# Patient Record
Sex: Male | Born: 1965
Health system: Southern US, Community
[De-identification: ages and names within clinical notes are randomized; demographics above are authoritative.]

---

## 1998-10-13 ENCOUNTER — Other Ambulatory Visit: Admission: RE | Admit: 1998-10-13 | Discharge: 1998-10-13 | Payer: Self-pay | Admitting: Urology

## 2007-12-01 ENCOUNTER — Ambulatory Visit (HOSPITAL_COMMUNITY): Admission: RE | Admit: 2007-12-01 | Discharge: 2007-12-01 | Payer: Self-pay | Admitting: *Deleted

## 2010-05-22 NOTE — Op Note (Signed)
NAME:  Jeffrey Weaver, Jeffrey Weaver NO.:  [id number]   MEDICAL RECORD NO.:  [id number]          PATIENT TYPE:  AMB   LOCATION:  ENDO                         FACILITY:  [facility]   PHYSICIAN:  Georgiana Spinner, M.D.    DATE OF BIRTH:  1965/10/10   DATE OF PROCEDURE:  DATE OF DISCHARGE:                               OPERATIVE REPORT   PROCEDURE:  Colonoscopy.   INDICATIONS:  Rectal bleeding.  Question of polyp in the rectum.   ANESTHESIA:  Fentanyl 85 mcg, Versed 8 mg.   PROCEDURE:  With the patient mildly sedated in the left lateral  decubitus position a rectal examination was performed.  Perineum  appeared normal and rectal digital exam felt normal to me including the  prostate.  Subsequently, the Pentax videoscopic colonoscope was inserted  in the rectum and passed under direct vision with pressure applied to  reach the cecum identified by ileocecal valve and appendiceal orifice  both which were photographed.  From this point the colonoscope was  slowly withdrawn taking circumferential views of colonic mucosa stopping  in the rectum which appeared normal on direct and showed possible small  hemorrhoids on retroflexed view.  The endoscope was straightened and  withdrawn, pulled through the anal canal which appeared normal.  The  patient's vital signs and pulse oximeter remained stable.  The patient  tolerated the procedure well without apparent complication.   FINDINGS:  Questionable small internal hemorrhoid, otherwise  unremarkable examination.   PLAN:  I would consider repeat examination in 5-10 years.           ______________________________  Georgiana Spinner, M.D.     GMO/MEDQ  D:  12/01/2007  T:  12/01/2007  Job:  [id number]

## 2010-06-26 ENCOUNTER — Emergency Department (HOSPITAL_COMMUNITY): Payer: 59

## 2010-06-26 ENCOUNTER — Emergency Department (HOSPITAL_COMMUNITY)
Admission: EM | Admit: 2010-06-26 | Discharge: 2010-06-26 | Disposition: A | Discharge status: Home / Self Care | Payer: 59 | Attending: Emergency Medicine | Admitting: Emergency Medicine

## 2010-06-26 DIAGNOSIS — K7689 Other specified diseases of liver: Secondary | ICD-10-CM | POA: Insufficient documentation

## 2010-06-26 DIAGNOSIS — N201 Calculus of ureter: Secondary | ICD-10-CM | POA: Insufficient documentation

## 2010-06-26 DIAGNOSIS — R1032 Left lower quadrant pain: Secondary | ICD-10-CM | POA: Insufficient documentation

## 2010-06-26 LAB — CBC
HCT: 44.9 % (ref 39.0–52.0)
RBC: 5.51 MIL/uL (ref 4.22–5.81)
RDW: 13.2 % (ref 11.5–15.5)
WBC: 14.9 10*3/uL — ABNORMAL HIGH (ref 4.0–10.5)

## 2010-06-26 LAB — URINE MICROSCOPIC-ADD ON

## 2010-06-26 LAB — URINALYSIS, ROUTINE W REFLEX MICROSCOPIC
Glucose, UA: NEGATIVE mg/dL
Ketones, ur: NEGATIVE mg/dL
Protein, ur: 30 mg/dL — AB

## 2010-06-26 LAB — DIFFERENTIAL
Basophils Absolute: 0 10*3/uL (ref 0.0–0.1)
Eosinophils Relative: 0 % (ref 0–5)
Lymphocytes Relative: 6 % — ABNORMAL LOW (ref 12–46)
Neutro Abs: 13.5 10*3/uL — ABNORMAL HIGH (ref 1.7–7.7)
Neutrophils Relative %: 90 % — ABNORMAL HIGH (ref 43–77)

## 2010-06-26 LAB — COMPREHENSIVE METABOLIC PANEL
AST: 25 U/L (ref 0–37)
Albumin: 4.4 g/dL (ref 3.5–5.2)
Alkaline Phosphatase: 84 U/L (ref 39–117)
BUN: 20 mg/dL (ref 6–23)
CO2: 27 mEq/L (ref 19–32)
Chloride: 101 mEq/L (ref 96–112)
GFR calc non Af Amer: >60 mL/min (ref >60)
Potassium: 4.6 mEq/L (ref 3.5–5.1)
Total Bilirubin: 1.3 mg/dL — ABNORMAL HIGH (ref 0.3–1.2)

## 2010-06-26 MED ORDER — IOHEXOL 300 MG/ML  SOLN
100.0000 mL | Freq: Once | INTRAMUSCULAR | Status: AC | PRN
  Administered 2010-06-26: 100 mL via INTRAVENOUS

## 2012-06-16 IMAGING — CT CT ABD-PELV W/ CM
1 of 3 series · 14 of 32 positions shown, 19 images · IV contrast (agent unspecified)
Comparison: None

CLINICAL DATA: Abdominal pain

CT ABDOMEN AND PELVIS WITH CONTRAST
TECHNIQUE: Multidetector CT imaging of the abdomen and pelvis was
performed following the standard protocol during bolus
administration of intravenous contrast.
Contrast: 100 ml of omni 300

[Series 2: rtn ap with st · axial · 0.79mm/px · z∈[-432,-2]mm · 14 of 98 slices shown, 19 images]
[im 6/98  soft-tissue]
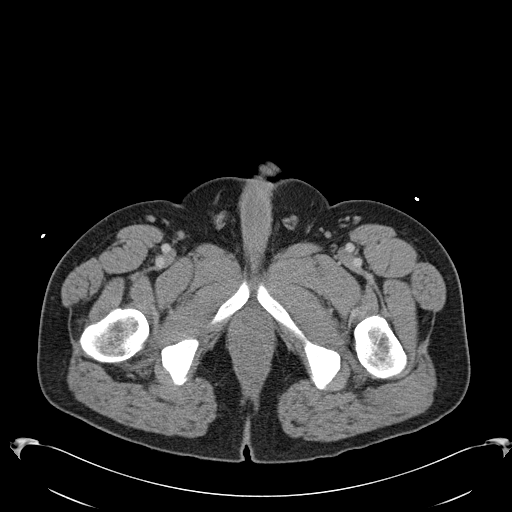
[im 6/98  bone]
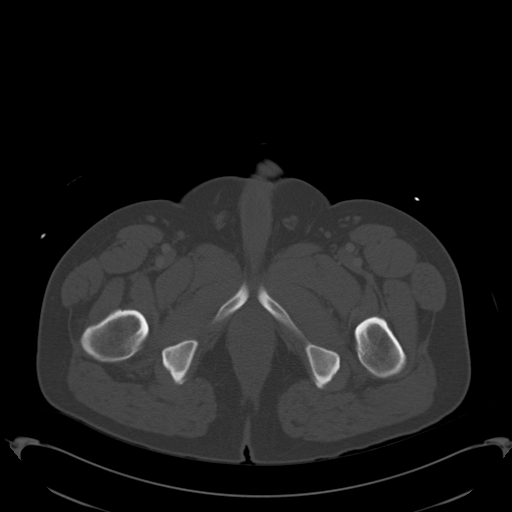
[im 16/98  soft-tissue]
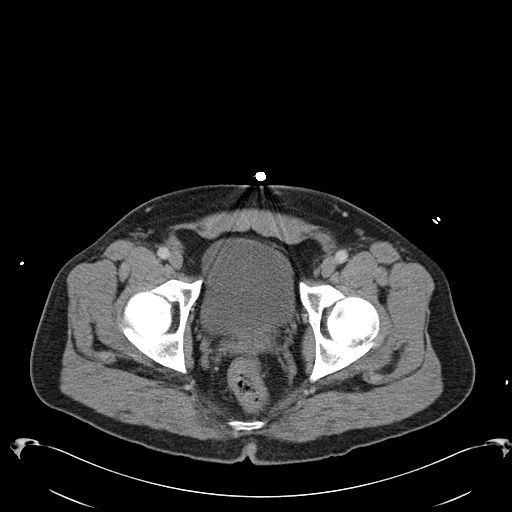
[im 21/98  soft-tissue]
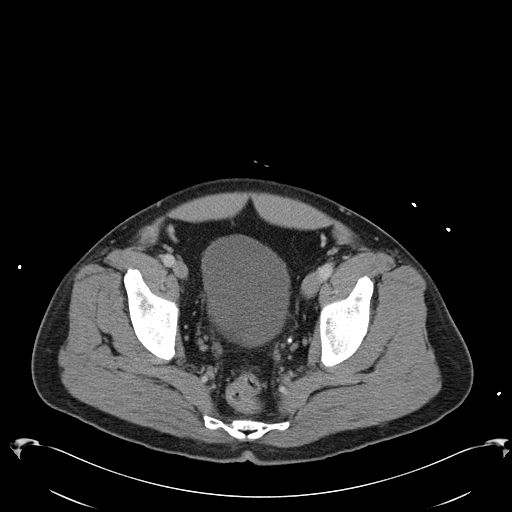
[im 26/98  soft-tissue]
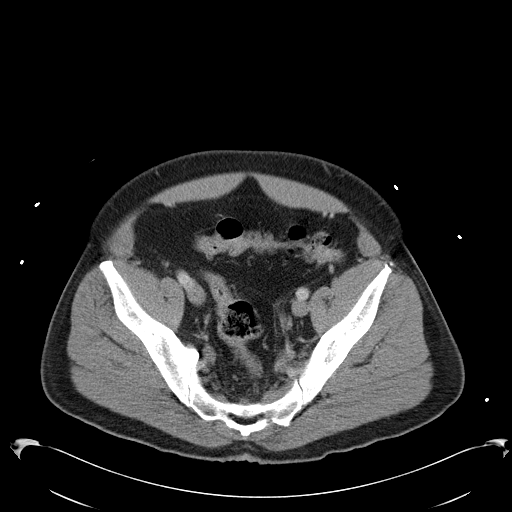
[im 36/98  soft-tissue]
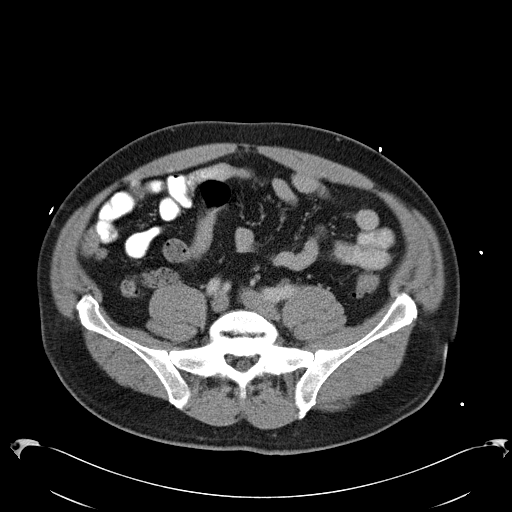
[im 41/98  soft-tissue]
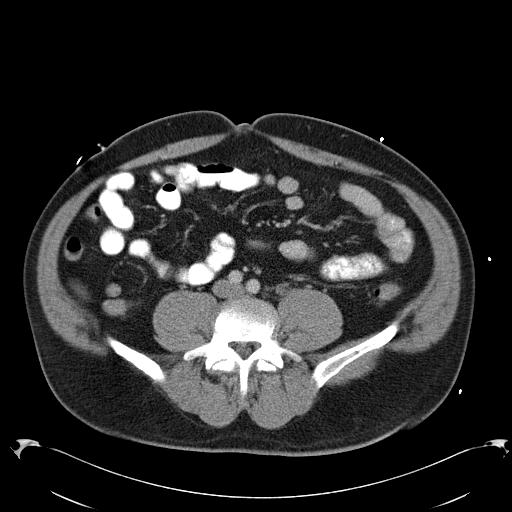
[im 52/98  soft-tissue]
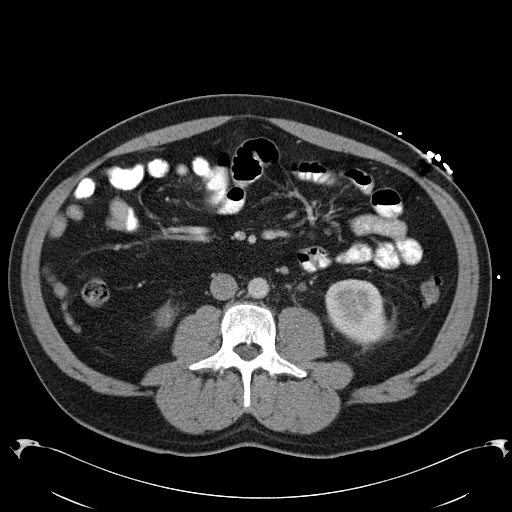
[im 57/98  soft-tissue]
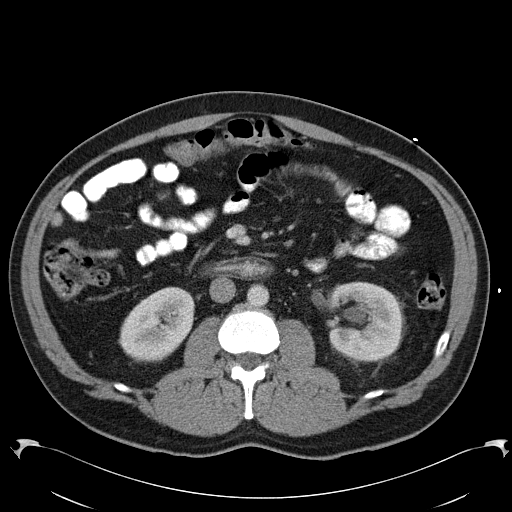
[im 62/98  soft-tissue]
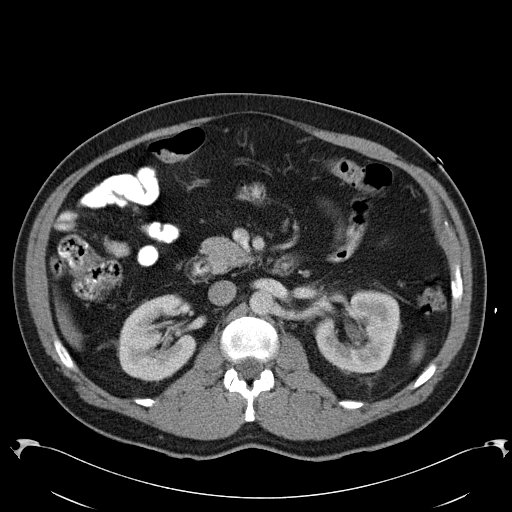
[im 62/98  bone]
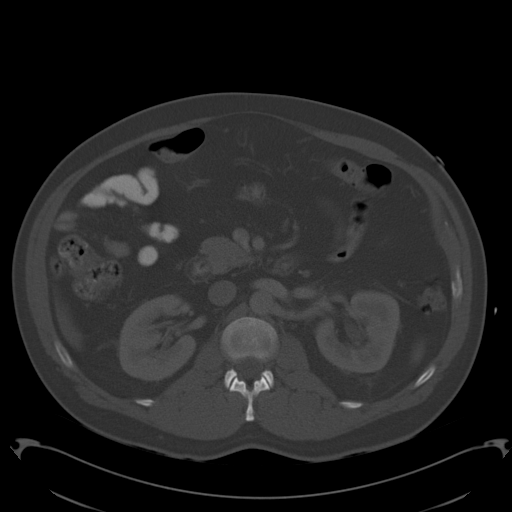
[im 72/98  soft-tissue]
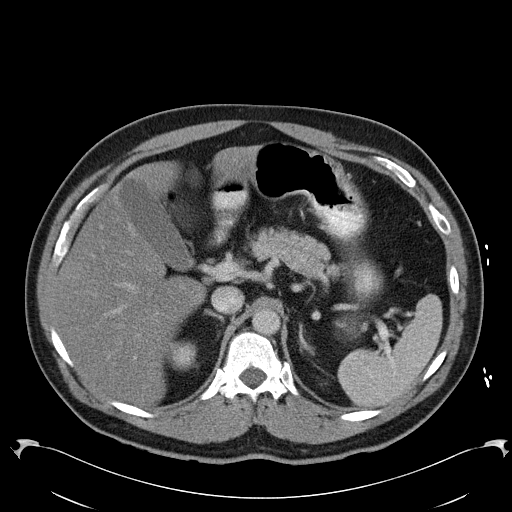
[im 77/98  soft-tissue]
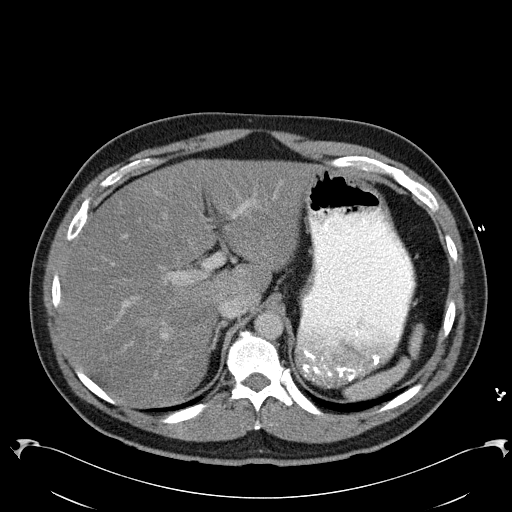
[im 77/98  lung]
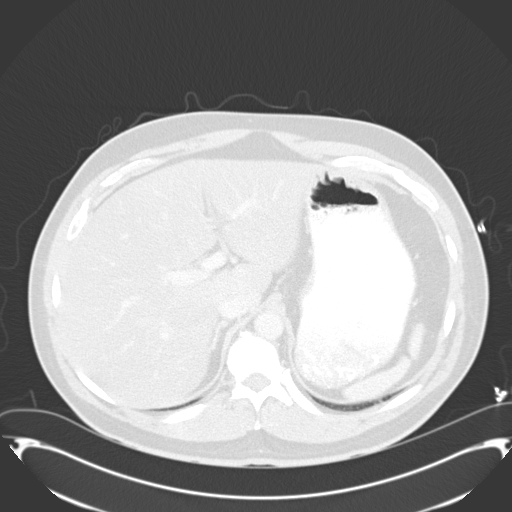
[im 82/98  soft-tissue]
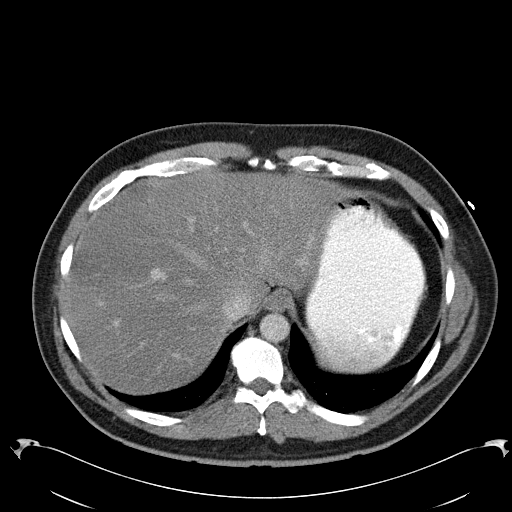
[im 82/98  lung]
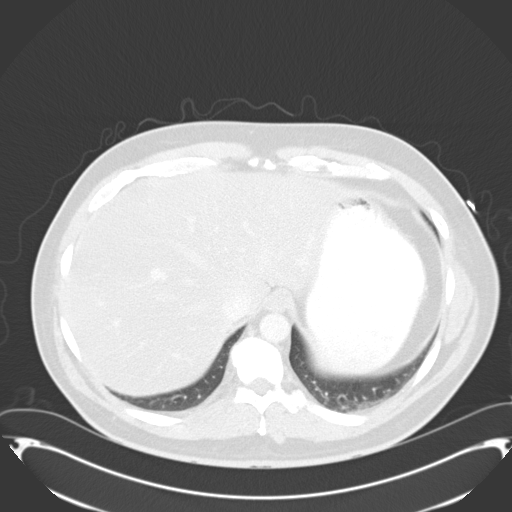
[im 87/98  lung]
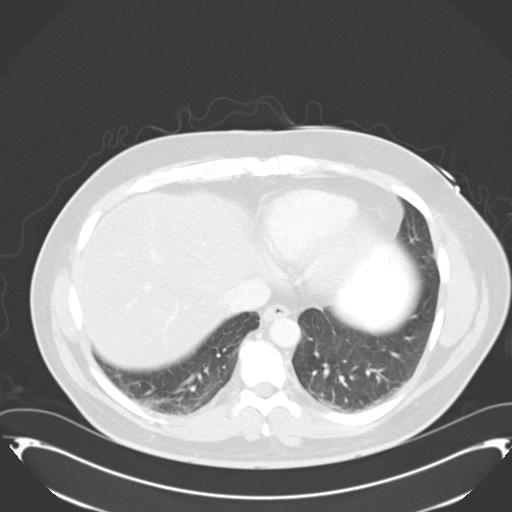
[im 92/98  soft-tissue]
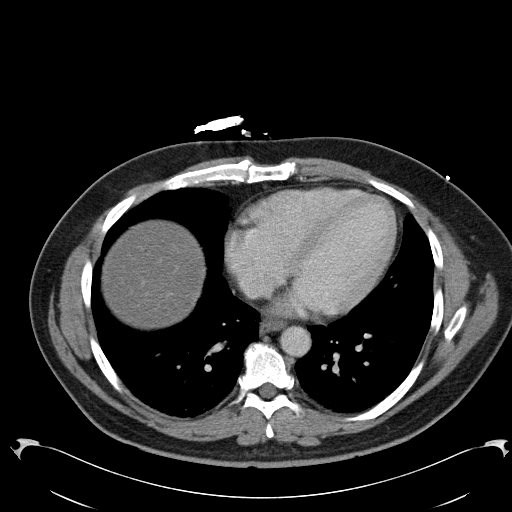
[im 92/98  lung]
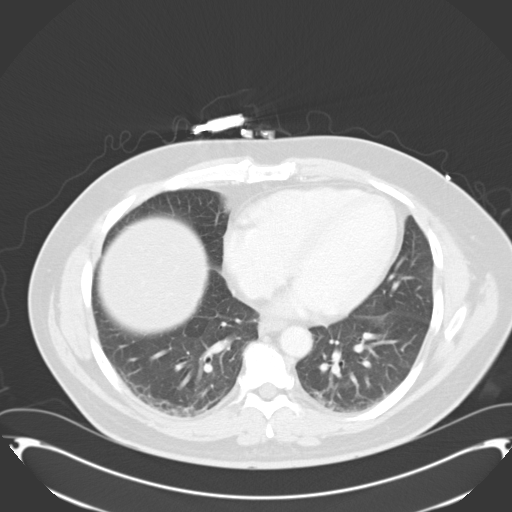

[14 of 32 positions shown; findings below may reference images not displayed]

FINDINGS: The lung bases are clear.

No pericardial or pleural effusion identified.  There is no focal
liver abnormalities identified.

Mild diffuse fatty infiltration of the liver parenchyma noted.

The pancreas appears normal.

The spleen appears normal.

Both adrenal glands are normal.

Gallbladder is identified and appears normal.  There is no biliary
dilatation.

Normal appearance of the right kidney.  Small hypodensity within
the upper pole the left kidney is noted.  This is too small to
characterize.

There is asymmetric left-sided hydronephrosis, hydroureter and
perinephric fat stranding.  Within the distal left ureter there is
a stone measuring 3.2 mm, image 78.

No enlarged upper abdominal lymph nodes.

There is no pelvic or inguinal lymph nodes.

No free fluid or fluid collections within the abdomen or pelvis.

The stomach and the small bowel loops appear normal.

The appendix is identified and is within normal limits.

The proximal colon is negative.

There are multiple diverticula involving the sigmoid colon.  No
active inflammation.

Review of the visualized osseous structures is significant for mild
spondylosis.
IMPRESSION: 1.  Left-sided hydronephrosis and hydroureter secondary to distal
left ureteral calculus.
2.  Fatty infiltration of the liver.

## 2017-07-17 DIAGNOSIS — I10 Essential (primary) hypertension: Secondary | ICD-10-CM | POA: Diagnosis not present

## 2017-07-17 DIAGNOSIS — Z125 Encounter for screening for malignant neoplasm of prostate: Secondary | ICD-10-CM | POA: Diagnosis not present

## 2017-07-17 DIAGNOSIS — Z0000 Encounter for general adult medical examination without abnormal findings: Secondary | ICD-10-CM | POA: Diagnosis not present

## 2017-07-17 DIAGNOSIS — R7309 Other abnormal glucose: Secondary | ICD-10-CM | POA: Diagnosis not present

## 2017-07-22 DIAGNOSIS — I10 Essential (primary) hypertension: Secondary | ICD-10-CM | POA: Diagnosis not present

## 2017-07-22 DIAGNOSIS — Z1212 Encounter for screening for malignant neoplasm of rectum: Secondary | ICD-10-CM | POA: Diagnosis not present

## 2017-07-22 DIAGNOSIS — G4733 Obstructive sleep apnea (adult) (pediatric): Secondary | ICD-10-CM | POA: Diagnosis not present

## 2017-07-22 DIAGNOSIS — Z0000 Encounter for general adult medical examination without abnormal findings: Secondary | ICD-10-CM | POA: Diagnosis not present

## 2017-07-30 DIAGNOSIS — E782 Mixed hyperlipidemia: Secondary | ICD-10-CM | POA: Diagnosis not present

## 2017-09-18 DIAGNOSIS — E782 Mixed hyperlipidemia: Secondary | ICD-10-CM | POA: Diagnosis not present

## 2017-09-18 DIAGNOSIS — E291 Testicular hypofunction: Secondary | ICD-10-CM | POA: Diagnosis not present

## 2017-09-22 DIAGNOSIS — E782 Mixed hyperlipidemia: Secondary | ICD-10-CM | POA: Diagnosis not present

## 2017-09-22 DIAGNOSIS — R899 Unspecified abnormal finding in specimens from other organs, systems and tissues: Secondary | ICD-10-CM | POA: Diagnosis not present

## 2017-09-22 DIAGNOSIS — E291 Testicular hypofunction: Secondary | ICD-10-CM | POA: Diagnosis not present

## 2017-09-29 DIAGNOSIS — E663 Overweight: Secondary | ICD-10-CM | POA: Diagnosis not present

## 2017-10-08 ENCOUNTER — Telehealth: Payer: Self-pay | Admitting: Hematology and Oncology

## 2017-10-08 ENCOUNTER — Encounter: Payer: Self-pay | Admitting: Hematology and Oncology

## 2017-10-08 NOTE — Telephone Encounter (Signed)
New referral received from Dr. Shelia Media for hemochromatosis. Pt has been cld and scheduled to see Dr. Audelia Hives on 10/16 at 11am. Pt aware to arrive 30 minutes early. Letter mailed.

## 2017-10-21 NOTE — Progress Notes (Signed)
[facility] Initial Consultation  Patient Name:  Jeffrey Weaver  DOB: 06-02-1965   Date of Service: October 22, 2017  Referring Provider: Deland Pretty, [name], [name] [address]   Consulting Physician: Henreitta Leber, MD Hematology/Oncology   Reason for Referral: In the setting of hyperferritinemia and possible iron overload of unknown etiology, he presents now for further diagnostic and therapeutic recommendations.  History Present Illness: Jeffrey Weaver is a 52 year old resident of [place] whose past medical history is significant for dyslipidemia; reflux uropathy; primary hypertension; allergic rhinitis; previous renal calculi 9 years earlier; hypogonadism; low testosterone; erectile dysfunction; and fatty liver.  He has had no recent hospitalizations.  He takes multivitamins and grapeseed extract.  He is not taking supplemental iron or vitamin C.  In his family there is no history of hereditary hemochromatosis or iron overload syndrome.  His primary care physician is Dr. Deland Pretty.  His is followed also by Dr. Madelin Rear, endocrinology.  He is alone at this first visit.  On September 22, 2017 a serum ferritin was 415 (30-400).  Serum iron was 95; TIBC 336; iron saturation 28%.  On September 19, 2017 prolactin level was 11.3 (4-15.2); LH 3.9 (1.7-8.6); FSH 5.0 (1.5-12.4); serum ferritin 481.  On July 23, 2017 serum testosterone 198; ferritin 481 total cholesterol 184 HDL cholesterol 28 LDL cholesterol 107 triglycerides 245; VLDL 49 on July 11: A complete blood count showed hemoglobin 14.8 hematocrit 43.4 WBC 7.2 with 52% neutrophils 36% lymphocytes 8% monocytes 4% eosinophils; platelets 258,000.  On July 22, 2017 serum testosterone 198 ferritin 481.  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  His alcohol intake consists of beer or wine once or twice monthly.  He has no diabetes mellitus coronary artery  disease, or cardiac dysrhythmia.  He reports no seizure disorder or stroke syndrome.  He has no glaucoma or macular degeneration.  There is no peptic ulcer or gastroesophageal reflux disease.  No viral hepatitis, inflammatory bowel disease, or symptomatic diverticulosis are reported.  He has had a screening colonoscopy 7 years ago.  He has no prostate cancer or prostatitis.  He reports no rheumatoid or gouty arthritis.  He has no prior blood disorder or bleeding tendency.  He denies peripheral arterial or venous thromboembolic disease.  He is not a vegetarian.  Both his appetite and weight are stable.  He has no rash or itching.  There is no unusual headache, dizziness, lightheadedness, syncope, or near syncopal episodes.  He has no visual changes or hearing deficit.  He denies unusual cough, sore throat, orthopnea.  He has no pain or difficulty in swallowing.  No fever, shaking chills, sweats, or flulike symptoms are evident.  He has no heartburn or indigestion.  No nausea, vomiting, diarrhea, or constipation are reported.  He denies melena or bright red blood per rectum.  No urinary frequency, urgency, hematuria, or dysuria are evident.  He has no focal bone, joint, or muscle pain.  There is no bleeding tendency or easy bruisability.  He denies numbness or tingling in the fingers or toes.  It is with this background he presents now for further diagnostic and therapeutic recommendations in the setting of hyperferritinemia to rule out iron overload.  Past Medical History: Hypertriglyceridemia Primary hypertension Renal calculi Reflux uropathy Hypogonadism Erectile dysfunction Low testosterone Allergic rhinitis Fatty liver  Surgical History: None  Family History: Mother: Age 2 years: Breast cancer Father: Age 74 years: Celiac disease Brothers (1): No major  medical problems He has no sisters  Social History   Socioeconomic History  . Marital status: Married    Spouse name: Not on file  .  Number of children: Not on file  . Years of education: Not on file  . Highest education level: Not on file  Occupational History  . Not on file  Social Needs  . Financial resource strain: Not on file  . Food insecurity:    Worry: Not on file    Inability: Not on file  . Transportation needs:    Medical: Not on file    Non-medical: Not on file  Tobacco Use  . Smoking status: Not on file  Substance and Sexual Activity  . Alcohol use: Not on file  . Drug use: Not on file  . Sexual activity: Not on file  Lifestyle  . Physical activity:    Days per week: Not on file    Minutes per session: Not on file  . Stress: Not on file  Relationships  . Social connections:    Talks on phone: Not on file    Gets together: Not on file    Attends religious service: Not on file    Active member of club or organization: Not on file    Attends meetings of clubs or organizations: Not on file    Relationship status: Not on file  . Intimate partner violence:    Fear of current or ex partner: Not on file    Emotionally abused: Not on file    Physically abused: Not on file    Forced sexual activity: Not on file  Other Topics Concern  . Not on file  Social History Narrative  . 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 He is married for the past 24 years. He has 3 children without major medical problems He is a lifetime non-smoker. No significant use of pipe, cigars, or chewing tobacco. His alcohol intake consists of 1-2 drinks monthly either beer and/or wine.  Transfusion History: No prior transfusion  Exposure History: He has no known exposure to toxic chemicals, radiation, or pesticides.  Allergies:  Penicillin causes swelling of the face, tongue, and lips [place], tree nuts, fresh fruits with seeds and skin, carrots, celery, and hazelnuts cause rash and diarrhea He has nonspecific seasonal allergies  Current Outpatient Medications on File Prior to Visit  Medication Sig  .  cetirizine (ZYRTEC) 10 MG chewable tablet Chew 10 mg by mouth daily.  . fenofibrate (TRICOR) 145 MG tablet Take 145 mg by mouth daily.  . fluticasone (FLONASE) 50 MCG/ACT nasal spray Place into both nostrils daily as needed for allergies or rhinitis.  Marland Kitchen irbesartan-hydrochlorothiazide (AVALIDE) 300-12.5 MG tablet Take 1 tablet by mouth daily.  . meloxicam (MOBIC) 15 MG tablet Take 15 mg by mouth daily.  . Misc Natural Products (GRAPE SEED COMPLEX) CAPS Take 2 capsules by mouth every morning.   . Multiple Vitamins-Minerals (MULTIVITAMIN WITH MINERALS) tablet Take 1 tablet by mouth daily.  Marland Kitchen omega-3 acid ethyl esters (LOVAZA) 1 g capsule Take 1 g by mouth 2 (two) times daily.   . tadalafil (ADCIRCA/CIALIS) 20 MG tablet Take 20 mg by mouth daily as needed for erectile dysfunction.   No current facility-administered medications on file prior to visit.     Review of Systems: Constitutional: No fever, sweats, or shaking chills.  No appetite or weight deficit; elevated BMI. Skin: No rash, scaling, sores, lumps, or jaundice. HEENT: No visual changes or hearing deficit;  allergic rhinitis; no sinusitis. Pulmonary: No unusual cough, sore throat, or orthopnea.  No DOE/COPD. Cardiovascular: No coronary artery disease, angina, or myocardial infarction.  No cardiac dysrhythmia,. Essential hypertension and dyslipidemia. Gastrointestinal: No indigestion, dysphagia, abdominal pain, diarrhea, or constipation.  No change in bowel habits. Genitourinary: No urinary frequency, urgency, hematuria, or dysuria; hypogonadism; erectile dysfunction; low testosterone. Musculoskeletal: No arthralgias or myalgias; no joint swelling, pain, or instability. Hematologic: No bleeding tendency or easy bruisability. Endocrine: No intolerance to heat or cold; no thyroid disease or diabetes mellitus. Vascular: No peripheral arterial or venous thromboembolic disease. Psychological: No anxiety, depression, or mood changes; no mental  health illnesses. Neurological: No dizziness, lightheadedness, syncope, or near syncopal episodes; no numbness or tingling in the fingers or toes.  Physical Examination: Vital Signs: Body surface area is 2.09 meters squared.  Vitals:   10/22/17 1107  BP: 137/75  Pulse: 66  Resp: 18  Temp: 98.4 F (36.9 C)  SpO2: 100%    Filed Weights   10/22/17 1107  Weight: 207 lb 3.2 oz (94 kg)  ECOG PERFORMANCE STATUS: 0 Constitutional:  Jeffrey Weaver is fully nourished and developed.  He looks age appropriate.  He is friendly and cooperative without respiratory compromise at rest. Skin: No rashes, scaling, dryness, jaundice, or itching. HEENT: Head is normocephalic and atraumatic.  Pupils are equal round and reactive to light and accommodation.  Sclerae are anicteric.  Conjunctivae are pink.  No sinus tenderness nor oropharyngeal lesions.  Lips without cracking or peeling; tongue without mass, inflammation, or nodularity.  Mucous membranes are moist. Neck: Supple and symmetric.  No jugular venous distention or thyromegaly.  Trachea is midline. Lymphatics: No cervical or supraclavicular lymphadenopathy.  No epitrochlear, axillary, or inguinal lymphadenopathy is appreciated. Respiratory/chest: Thorax is symmetrical.  Breath sounds are clear to auscultation and percussion.  Normal excursion and respiratory effort. Back: Symmetric without deformity or tenderness. Cardiovascular: Heart rate and rhythm are regular without murmurs or gallops Gastrointestinal: Abdomen is soft, nontender; no organomegaly.  Bowel sounds are normoactive.  No masses are appreciated. Genitourinary: Normal external male genitalia. Rectal examination: Not performed. Extremities: In the lower extremities, there is no asymmetric swelling, erythema, tenderness, or cord formation.  No clubbing, cyanosis, nor edema. Hematologic: No petechiae, hematomas, or ecchymoses. Psychological:  He is oriented to person, place, and time; normal  affect, memory, and cognition. Neurological: There are no gross neurologic deficits.  Laboratory Results: I have reviewed the data as listed: October 22, 2017   Ref Range & Units 12:27  WBC Count 4.0 - 10.5 K/uL 6.0   RBC 4.22 - 5.81 MIL/uL 5.37   Hemoglobin 13.0 - 17.0 g/dL 15.4   HCT 39.0 - 52.0 % 45.4   MCV 80.0 - 100.0 fL 84.5   MCH 26.0 - 34.0 pg 28.7   MCHC 30.0 - 36.0 g/dL 33.9   RDW 11.5 - 15.5 % 13.2   Platelet Count 150 - 400 K/uL 253   nRBC 0.0 - 0.2 % 0.0   Neutrophils Relative % % 53   Neutro Abs 1.7 - 7.7 K/uL 3.2   Lymphocytes Relative % 34   Lymphs Abs 0.7 - 4.0 K/uL 2.0   Monocytes Relative % 7   Monocytes Absolute 0.1 - 1.0 K/uL 0.4   Eosinophils Relative % 5   Eosinophils Absolute 0.0 - 0.5 K/uL 0.3   Basophils Relative % 1   Basophils Absolute 0.0 - 0.1 K/uL 0.0   Immature Granulocytes % 0   Abs Immature Granulocytes 0.00 -  0.07 K/uL 0.02         Ref Range & Units 12:27 80yr ago  Sodium 135 - 145 mmol/L 140  138 R  Potassium 3.5 - 5.1 mmol/L 4.4  4.6 R  Chloride 98 - 111 mmol/L 105  101 R  CO2 22 - 32 mmol/L 25  27 R  Glucose, Bld 70 - 99 mg/dL 98  141High    BUN 6 - 20 mg/dL 22High   20 R  Creatinine, Ser 0.61 - 1.24 mg/dL 1.02  0.86 R, CM  Calcium 8.9 - 10.3 mg/dL 9.8  10.6High  R  Total Protein 6.5 - 8.1 g/dL 7.8  8.0 R  Albumin 3.5 - 5.0 g/dL 4.3  4.4 R  AST 15 - 41 U/L 39  25 R  ALT 0 - 44 U/L 66High   40 R  Alkaline Phosphatase 38 - 126 U/L 49  84 R  Total Bilirubin 0.3 - 1.2 mg/dL 1.1  1.3High    GFR calc non Af Amer >60 mL/min >60  >60   GFR calc Af Amer >60 mL/min >60  >60 CM  Comment: (NOTE)   Iron 129 TIBC 360 Iron saturation 36% (42-163%) Ferritin 380 (24-336) LDH 151 Reticulocyte count 2.0%  CBC Latest Ref Rng & Units 10/22/2017 06/26/2010  WBC 4.0 - 10.5 K/uL 6.0 14.9(H)  Hemoglobin 13.0 - 17.0 g/dL 15.4 16.0  Hematocrit 39.0 - 52.0 % 45.4 44.9  Platelets 150 - 400 K/uL 253 233    CMP Latest Ref Rng & Units 10/22/2017  06/26/2010  Glucose 70 - 99 mg/dL 98 141(H)  BUN 6 - 20 mg/dL 22(H) 20  Creatinine 0.61 - 1.24 mg/dL 1.02 0.86  Sodium 135 - 145 mmol/L 140 138  Potassium 3.5 - 5.1 mmol/L 4.4 4.6  Chloride 98 - 111 mmol/L 105 101  CO2 22 - 32 mmol/L 25 27  Calcium 8.9 - 10.3 mg/dL 9.8 10.6(H)  Total Protein 6.5 - 8.1 g/dL 7.8 8.0  Total Bilirubin 0.3 - 1.2 mg/dL 1.1 1.3(H)  Alkaline Phos 38 - 126 U/L 49 84  AST 15 - 41 U/L 39 25  ALT 0 - 44 U/L 66(H) 40   Diagnostic/Imaging Studies: June 26, 2010 CT ABDOMEN AND PELVIS WITH CONTRAST  Technique:  Multidetector CT imaging of the abdomen and pelvis was performed following the standard protocol during bolus administration of intravenous contrast.  Contrast: 100 ml of omni 300  Comparison: None  Findings:  The lung bases are clear.  No pericardial or pleural effusion identified.  There is no focal liver abnormalities identified.  Mild diffuse fatty infiltration of the liver parenchyma noted.  The pancreas appears normal.  The spleen appears normal.  Both adrenal glands are normal.  Gallbladder is identified and appears normal.  There is no biliary dilatation.  Normal appearance of the right kidney.  Small hypodensity within the upper pole the left kidney is noted.  This is too small to characterize.  There is asymmetric left-sided hydronephrosis, hydroureter and perinephric fat stranding.  Within the distal left ureter there is a stone measuring 3.2 mm, image 78.  No enlarged upper abdominal lymph nodes.  There is no pelvic or inguinal lymph nodes.  No free fluid or fluid collections within the abdomen or pelvis.  The stomach and the small bowel loops appear normal.  The appendix is identified and is within normal limits.  The proximal colon is negative.  There are multiple diverticula involving the sigmoid colon.  No active inflammation.  Review of the visualized osseous structures is significant for  mild spondylosis.  IMPRESSION:  1.  Left-sided hydronephrosis and hydroureter secondary to distal left ureteral calculus. 2.  Fatty infiltration of the liver.  Angelita Ingles, M.D.  Summary/Assessment: In the setting of hyperferritinemia and possible iron overload of unknown etiology, he presents now for further diagnostic and therapeutic recommendations.  On September 22, 2017 a serum ferritin was 415 (30-400).  Serum iron was 95; TIBC 336; iron saturation 28%.  On September 19, 2017 prolactin level was 11.3 (4-15.2); LH 3.9 (1.7-8.6); FSH 5.0 (1.5-12.4); serum ferritin 481.  On July 23, 2017 serum testosterone 198; ferritin 481 total cholesterol 184 HDL cholesterol 28 LDL cholesterol 107 triglycerides 245; VLDL 49 on July 11: A complete blood count showed hemoglobin 14.8 hematocrit 43.4 WBC 7.2 with 52% neutrophils 36% lymphocytes 8% monocytes 4% eosinophils; platelets 258,000.  On July 22, 2017 serum testosterone 198 ferritin 481.  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  His alcohol intake consists of beer or wine once or twice monthly.  He has no diabetes mellitus coronary artery disease, or cardiac dysrhythmia.  He reports no seizure disorder or stroke syndrome.  He has no glaucoma or macular degeneration.  There is no peptic ulcer or gastroesophageal reflux disease.  No viral hepatitis, inflammatory bowel disease, or symptomatic diverticulosis are reported.  He has had a screening colonoscopy 7 years ago.  He has no prostate cancer or prostatitis.  He reports no rheumatoid or gouty arthritis.  He has no prior blood disorder or bleeding tendency.  He denies peripheral arterial or venous thromboembolic disease.  He is not a vegetarian.  Both his appetite and weight are stable.  He has no rash or itching.  There is no unusual headache, dizziness, lightheadedness, syncope, or near syncopal episodes.  He has no visual changes or hearing deficit.  He denies unusual cough, sore throat,  orthopnea.  He has no pain or difficulty in swallowing.  No fever, shaking chills, sweats, or flulike symptoms are evident.  He has no heartburn or indigestion.  No nausea, vomiting, diarrhea, or constipation are reported.  He denies melena or bright red blood per rectum.  No urinary frequency, urgency, hematuria, or dysuria are evident.  He has no focal bone, joint, or muscle pain.  There is no bleeding tendency or easy bruisability.  He denies numbness or tingling in the fingers or toes.  Recommendation/Plan: The results of his laboratory studies were not available at the time of discharge.  Some of those results are outlined above.  They will be discussed in detail the time of his next visit.  Laboratory studies were obtained to exclude liver disease, hemolysis, or metabolic anomaly.  His iron studies are somewhat confounding.  Both the iron and the TIBC (transferrin) suggest possible iron deficit without anemia.   His serum ferritin is once again mildly elevated.  While this could imply an iron overload phenomenon, the ferritin could also be spuriously elevated as a acute phase reactant.  The SGPT is mildly elevated.  The SGOT is normal.  Those results are detailed above.  I have recommended CRP and HFE gene mutation to exclude either a chronic inflammatory condition responsible for increasing his ferritin when both the iron and the TIBC suggest "iron deficiency" without anemia.  In an active infection/inflammation, the CRP would be elevated and the serum ferritin could also be spuriously elevated. Although nonspecific, the CRP is exquisitely sensitive to infection/inflammation. There would be no impact on  the HFE gene mutation for hereditary hemochromatosis. Those results are pending.  He was advised not to take multivitamins with iron or vitamin C, since vitamin C enhances absorption of dietary iron.  Barring any unforeseen complications, at his request, his next scheduled doctor visit is on  November 11, 2017.  [place] was advised to call us in the interim should any new or untoward problems arise.  The total time spent discussing his previous laboratory studies, methodology for evaluating a mildly elevated ferritin,  preliminary considerations, recommendations was 50 minutes.  At least 50% of that time was spent in discussion, reviewing outside records, laboratory evaluation, counseling, and answering questions. All questions were answered to his satisfaction.   This note was dictated using voice activated technology/software.  Unfortunately, typographical errors are not uncommon, and transcription is subject to mistakes and regrettably misinterpretation.  If necessary, clarification of the above information can be discussed with me at any time.  Thank you Dr. Shelia Media for allowing my participation in the care of Jeffrey Weaver. I will keep you closely informed as the results of his laboratory data become available.  Please do not hesitate to call should any questions arise regarding this initial consultation and discussion.  FOLLOW UP: AS DIRECTED   cc:         Deland Pretty MD.                Madelin Rear MD   Henreitta Leber, MD  Hematology/Oncology [place] [address] [place], [place] [address] Office: [phone] TTSV: [phone]

## 2017-10-22 ENCOUNTER — Encounter: Payer: Self-pay | Admitting: Hematology and Oncology

## 2017-10-22 ENCOUNTER — Inpatient Hospital Stay: Payer: 59 | Attending: Hematology and Oncology | Admitting: Hematology and Oncology

## 2017-10-22 ENCOUNTER — Inpatient Hospital Stay: Payer: 59

## 2017-10-22 ENCOUNTER — Telehealth: Payer: Self-pay | Admitting: Hematology and Oncology

## 2017-10-22 DIAGNOSIS — K760 Fatty (change of) liver, not elsewhere classified: Secondary | ICD-10-CM | POA: Insufficient documentation

## 2017-10-22 DIAGNOSIS — I10 Essential (primary) hypertension: Secondary | ICD-10-CM | POA: Insufficient documentation

## 2017-10-22 DIAGNOSIS — J309 Allergic rhinitis, unspecified: Secondary | ICD-10-CM

## 2017-10-22 DIAGNOSIS — E781 Pure hyperglyceridemia: Secondary | ICD-10-CM | POA: Insufficient documentation

## 2017-10-22 DIAGNOSIS — R7989 Other specified abnormal findings of blood chemistry: Secondary | ICD-10-CM | POA: Insufficient documentation

## 2017-10-22 DIAGNOSIS — E291 Testicular hypofunction: Secondary | ICD-10-CM | POA: Insufficient documentation

## 2017-10-22 LAB — CBC WITH DIFFERENTIAL (CANCER CENTER ONLY)
Abs Immature Granulocytes: 0.02 10*3/uL (ref 0.00–0.07)
Basophils Absolute: 0 10*3/uL (ref 0.0–0.1)
Basophils Relative: 1 %
EOS ABS: 0.3 10*3/uL (ref 0.0–0.5)
EOS PCT: 5 %
HEMATOCRIT: 45.4 % (ref 39.0–52.0)
HEMOGLOBIN: 15.4 g/dL (ref 13.0–17.0)
Immature Granulocytes: 0 %
LYMPHS ABS: 2 10*3/uL (ref 0.7–4.0)
Lymphocytes Relative: 34 %
MCH: 28.7 pg (ref 26.0–34.0)
MCHC: 33.9 g/dL (ref 30.0–36.0)
MCV: 84.5 fL (ref 80.0–100.0)
MONOS PCT: 7 %
Monocytes Absolute: 0.4 10*3/uL (ref 0.1–1.0)
Neutro Abs: 3.2 10*3/uL (ref 1.7–7.7)
Neutrophils Relative %: 53 %
Platelet Count: 253 10*3/uL (ref 150–400)
RBC: 5.37 MIL/uL (ref 4.22–5.81)
RDW: 13.2 % (ref 11.5–15.5)
WBC Count: 6 10*3/uL (ref 4.0–10.5)
nRBC: 0 % (ref 0.0–0.2)

## 2017-10-22 LAB — COMPREHENSIVE METABOLIC PANEL
ALK PHOS: 49 U/L (ref 38–126)
ALT: 66 U/L — AB (ref 0–44)
AST: 39 U/L (ref 15–41)
Albumin: 4.3 g/dL (ref 3.5–5.0)
Anion gap: 10 (ref 5–15)
BUN: 22 mg/dL — ABNORMAL HIGH (ref 6–20)
CALCIUM: 9.8 mg/dL (ref 8.9–10.3)
CO2: 25 mmol/L (ref 22–32)
CREATININE: 1.02 mg/dL (ref 0.61–1.24)
Chloride: 105 mmol/L (ref 98–111)
Glucose, Bld: 98 mg/dL (ref 70–99)
Potassium: 4.4 mmol/L (ref 3.5–5.1)
Sodium: 140 mmol/L (ref 135–145)
Total Bilirubin: 1.1 mg/dL (ref 0.3–1.2)
Total Protein: 7.8 g/dL (ref 6.5–8.1)

## 2017-10-22 LAB — RETICULOCYTES
Immature Retic Fract: 7.2 % (ref 2.3–15.9)
RBC.: 5.37 MIL/uL (ref 4.22–5.81)
RETIC COUNT ABSOLUTE: 108.5 10*3/uL (ref 19.0–186.0)
RETIC CT PCT: 2 % (ref 0.4–3.1)

## 2017-10-22 LAB — IRON AND TIBC
IRON: 129 ug/dL (ref 42–163)
Saturation Ratios: 36 % — ABNORMAL LOW (ref 42–163)
TIBC: 360 ug/dL (ref 202–409)
UIBC: 231 ug/dL

## 2017-10-22 LAB — FERRITIN: Ferritin: 380 ng/mL — ABNORMAL HIGH (ref 24–336)

## 2017-10-22 LAB — LACTATE DEHYDROGENASE: LDH: 151 U/L (ref 98–192)

## 2017-10-22 NOTE — Patient Instructions (Addendum)
We discussed in detail the results of your prior laboratory studies especially your mildly elevated serum ferritin level.  Laboratory studies are requested today to identify any specific causes of iron overload.  Those results will be discussed in detail at the time of your next visit.  Because your ferritin level was elevated but only slightly, gene mutation studies for hereditary hemochromatosis were not performed at this visit.  Should the ferritin level be elevated however, I would consider more extensive testing.  You should avoid any vitamins with iron.  Avoid also vitamin C since it can promote iron absorption.  Barring any unforeseen complications, at your request, your next scheduled doctor visit to discuss those results is on November 5.  Please do not hesitate to call should any questions or problems arise in the interim.  Thank you!  Ladona Ridgel, MD Hematology/Oncology [phone]

## 2017-10-22 NOTE — Telephone Encounter (Signed)
Scheduled appt per 10/16 los - gave patient aVS and calender per los.   

## 2017-10-23 ENCOUNTER — Inpatient Hospital Stay: Payer: 59

## 2017-10-23 DIAGNOSIS — I10 Essential (primary) hypertension: Secondary | ICD-10-CM | POA: Diagnosis not present

## 2017-10-23 LAB — C-REACTIVE PROTEIN

## 2017-10-23 LAB — HAPTOGLOBIN: Haptoglobin: 118 mg/dL (ref 34–200)

## 2017-10-28 LAB — HEMOCHROMATOSIS DNA-PCR(C282Y,H63D)

## 2017-11-11 ENCOUNTER — Encounter: Payer: Self-pay | Admitting: Hematology and Oncology

## 2017-11-11 ENCOUNTER — Inpatient Hospital Stay: Payer: 59 | Attending: Hematology and Oncology | Admitting: Hematology and Oncology

## 2017-11-11 ENCOUNTER — Telehealth: Payer: Self-pay | Admitting: Hematology and Oncology

## 2017-11-11 VITALS — BP 135/76 | HR 71 | Temp 99.1°F | Resp 12 | Ht 66.0 in | Wt 211.2 lb

## 2017-11-11 DIAGNOSIS — D225 Melanocytic nevi of trunk: Secondary | ICD-10-CM | POA: Diagnosis not present

## 2017-11-11 DIAGNOSIS — I10 Essential (primary) hypertension: Secondary | ICD-10-CM | POA: Insufficient documentation

## 2017-11-11 DIAGNOSIS — N529 Male erectile dysfunction, unspecified: Secondary | ICD-10-CM | POA: Diagnosis not present

## 2017-11-11 DIAGNOSIS — D485 Neoplasm of uncertain behavior of skin: Secondary | ICD-10-CM | POA: Diagnosis not present

## 2017-11-11 DIAGNOSIS — Z79899 Other long term (current) drug therapy: Secondary | ICD-10-CM | POA: Diagnosis not present

## 2017-11-11 DIAGNOSIS — C4441 Basal cell carcinoma of skin of scalp and neck: Secondary | ICD-10-CM | POA: Diagnosis not present

## 2017-11-11 DIAGNOSIS — R7989 Other specified abnormal findings of blood chemistry: Secondary | ICD-10-CM | POA: Diagnosis not present

## 2017-11-11 DIAGNOSIS — N132 Hydronephrosis with renal and ureteral calculous obstruction: Secondary | ICD-10-CM | POA: Insufficient documentation

## 2017-11-11 DIAGNOSIS — E291 Testicular hypofunction: Secondary | ICD-10-CM | POA: Insufficient documentation

## 2017-11-11 DIAGNOSIS — K760 Fatty (change of) liver, not elsewhere classified: Secondary | ICD-10-CM | POA: Insufficient documentation

## 2017-11-11 NOTE — Progress Notes (Signed)
Hematology/Oncology Outpatient Progress Note  Patient Name:  Jeffrey Weaver  DOB: 1965/12/25   Date of Service: November 11, 2017  Referring Provider: Deland Pretty, MD [address] [place], [place] [address]   Consulting Physician: Henreitta Leber, MD Hematology/Oncology  Reason for Visit: In the setting of mild hyperferritinemia, he presents now for the results of his preliminary evaluation and recommendations.  Brief History: Jeffrey Weaver is a 52 year old resident of [place] whose past medical history is significant for dyslipidemia; reflux uropathy; primary hypertension; allergic rhinitis; previous renal calculi 9 years earlier; hypogonadism; low testosterone; erectile dysfunction; and fatty liver.  He has had no recent hospitalizations.  He takes multivitamins and grapeseed extract.  He is not taking supplemental iron or vitamin C.  In his family there is no history of hereditary hemochromatosis or iron overload syndrome.  His primary care physician is Dr. Deland Pretty.  His is followed also by Dr. Madelin Rear, endocrinology.  He is alone at this first visit.  On September 22, 2017 a serum ferritin was 415 (30-400).  Serum iron was 95; TIBC 336; iron saturation 28%.  On September 19, 2017 prolactin level was 11.3 (4-15.2); LH 3.9 (1.7-8.6); FSH 5.0 (1.5-12.4); serum ferritin 481.  On July 23, 2017 serum testosterone 198; ferritin 481 total cholesterol 184 HDL cholesterol 28 LDL cholesterol 107 triglycerides 245; VLDL 49 on July 11: A complete blood count showed hemoglobin 14.8 hematocrit 43.4 WBC 7.2 with 52% neutrophils 36% lymphocytes 8% monocytes 4% eosinophils; platelets 258,000.  On July 22, 2017 serum testosterone 198 ferritin 481.  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  His alcohol intake consists of beer or wine once or twice monthly. He has had a screening colonoscopy 7 years ago.  He has no prostate cancer or prostatitis. He is not a vegetarian.  At  the time of our initial visit, laboratory studies were obtained to exclude liver disease, hemolysis, or metabolic anomaly.  His iron studies are somewhat confounding.  Both the iron and the TIBC (transferrin) suggest possible iron deficit without anemia.  His serum ferritin is once again mildly elevated (380).   This does unlikely suggest an iron overload syndrome. The ferritin could also be spuriously elevated as it is also an acute phase reactant.   The SGPT is mildly elevated.  The SGOT is normal.  Those results are detailed below. An abdominal ultrasound from June 26, 2010 suggested mild diffuse fatty infiltration of the liver parenchyma. We requested a CRP and HFE gene mutation to exclude either a chronic inflammatory condition responsible for increasing his ferritin. Although nonspecific, the CRP is sensitive to infection/inflammation. He was advised not to take multivitamins with iron or vitamin C, since vitamin C enhances absorption of dietary iron.  The HFE gene mutation was not identified.  Likewise his CRP was normal.  As mentioned earlier, his ferritin is decreasing, 380.  It is with this background he presents now for further discussion and recommendations in the setting of hyperferritinemia  Interval History: In the interim since his last visit, he reports no new problems or complaints. Both his appetite and weight are stable.  He has no rash or itching.  There is no unusual headache, dizziness, lightheadedness, syncope, or near syncopal episodes.  He has no visual changes or hearing deficit. He denies unusual cough, sore throat, orthopnea.  He has no pain or difficulty in swallowing.  No fever, shaking chills, sweats, or flulike symptoms are evident.  He has no heartburn or indigestion.  No nausea, vomiting, diarrhea, or constipation are reported.  He denies melena or bright red blood per rectum.  No urinary frequency, urgency, hematuria, or dysuria are evident.  He has no focal bone, joint, or  muscle pain.  There is no bleeding tendency or easy bruisability.  He denies numbness or tingling in the fingers or toes.    Past Medical History Reviewed        Family History Reviewed       Social History Reviewed  Past Medical History: Hypertriglyceridemia Primary hypertension Renal calculi Reflux uropathy Hypogonadism Erectile dysfunction Low testosterone Allergic rhinitis Fatty liver  Allergies  Allergen Reactions  . Penicillins     Swelling of the throat and lips  Penicillin causes swelling of the face, tongue, and lips [place], tree nuts, fresh fruits with seeds and skin, carrots, celery, and hazelnuts cause rash and diarrhea He has nonspecific seasonal allergies  Current Outpatient Medications on File Prior to Visit  Medication Sig  . cetirizine (ZYRTEC) 10 MG chewable tablet Chew 10 mg by mouth daily.  . fenofibrate (TRICOR) 145 MG tablet Take 145 mg by mouth daily.  . fluticasone (FLONASE) 50 MCG/ACT nasal spray Place into both nostrils daily as needed for allergies or rhinitis.  Marland Kitchen irbesartan-hydrochlorothiazide (AVALIDE) 300-12.5 MG tablet Take 1 tablet by mouth daily.  . meloxicam (MOBIC) 15 MG tablet Take 15 mg by mouth daily.  . Misc Natural Products (GRAPE SEED COMPLEX) CAPS Take 2 capsules by mouth every morning.   . Multiple Vitamins-Minerals (MULTIVITAMIN WITH MINERALS) tablet Take 1 tablet by mouth daily.  Marland Kitchen omega-3 acid ethyl esters (LOVAZA) 1 g capsule Take 1 g by mouth 2 (two) times daily.   . tadalafil (ADCIRCA/CIALIS) 20 MG tablet Take 20 mg by mouth daily as needed for erectile dysfunction.   No current facility-administered medications on file prior to visit.     Review of Systems: Constitutional: No fever, sweats, or shaking chills.  No appetite or weight deficit; elevated BMI. Skin: No rash, scaling, sores, lumps, or jaundice. HEENT: No visual changes or hearing deficit; allergic rhinitis; no sinusitis. Pulmonary: No unusual cough, sore throat,  or orthopnea.  No DOE/COPD. Cardiovascular: No coronary artery disease, angina, or myocardial infarction.  No cardiac dysrhythmia,. Essential hypertension and dyslipidemia. Gastrointestinal: No indigestion, dysphagia, abdominal pain, diarrhea, or constipation.  No change in bowel habits. Genitourinary: No urinary frequency, urgency, hematuria, or dysuria; hypogonadism; erectile dysfunction; low testosterone. Musculoskeletal: No arthralgias or myalgias; no joint swelling, pain, or instability. Hematologic: No bleeding tendency or easy bruisability. Endocrine: No intolerance to heat or cold; no thyroid disease or diabetes mellitus. Vascular: No peripheral arterial or venous thromboembolic disease. Psychological: No anxiety, depression, or mood changes; no mental health illnesses. Neurological: No dizziness, lightheadedness, syncope, or near syncopal episodes; no numbness or tingling in the fingers or toes.  Physical Examination: Vital Signs: Body surface area is 2.11 meters squared.  Vitals:   11/11/17 1026  BP: 135/76  Pulse: 71  Resp: 12  Temp: 99.1 F (37.3 C)  SpO2: 98%    Filed Weights   11/11/17 1026  Weight: 211 lb 3.2 oz (95.8 kg)  Body mass index is 34.09 kg/m. Constitutional:  Houston Zapien is fully nourished and developed.  He looks age appropriate.  He is friendly and cooperative without respiratory compromise at rest. Skin: No rashes, scaling, dryness, jaundice, or itching. HEENT: Head is normocephalic and atraumatic.  Pupils are equal round and reactive to light and accommodation.  Sclerae are anicteric.  Conjunctivae are pink.  No sinus tenderness nor oropharyngeal lesions.  Lips without cracking or peeling; tongue without mass, inflammation, or nodularity.  Mucous membranes are moist. Neck: Supple and symmetric.  No jugular venous distention or thyromegaly.  Trachea is midline. Lymphatics: No cervical or supraclavicular lymphadenopathy.  No epitrochlear, axillary, or  inguinal lymphadenopathy is appreciated. Respiratory/chest: Thorax is symmetrical.  Breath sounds are clear to auscultation and percussion.  Normal excursion and respiratory effort. Back: Symmetric without deformity or tenderness. Cardiovascular: Heart rate and rhythm are regular without murmurs or gallops Gastrointestinal: Abdomen is soft, nontender; no organomegaly.  Bowel sounds are normoactive.  No masses are appreciated. Genitourinary: Normal external male genitalia. Rectal examination: Not performed. Extremities: In the lower extremities, there is no asymmetric swelling, erythema, tenderness, or cord formation.  No clubbing, cyanosis, nor edema. Hematologic: No petechiae, hematomas, or ecchymoses. Psychological:  He is oriented to person, place, and time; normal affect, memory, and cognition. Neurological: There are no gross neurologic deficits.  Laboratory Results: October 22, 2017  Ref Range & Units 2wk ago (10/22/17) 55yr ago (06/26/10) 86yr ago (06/26/10)  WBC Count 4.0 - 10.5 K/uL 6.0  14.9High     RBC 4.22 - 5.81 MIL/uL 5.37  5.51    Hemoglobin 13.0 - 17.0 g/dL 15.4  16.0    HCT 39.0 - 52.0 % 45.4  44.9    MCV 80.0 - 100.0 fL 84.5  81.5 R   MCH 26.0 - 34.0 pg 28.7  29.0    MCHC 30.0 - 36.0 g/dL 33.9  35.6    RDW 11.5 - 15.5 % 13.2  13.2    Platelet Count 150 - 400 K/uL 253  233    nRBC 0.0 - 0.2 % 0.0     Neutrophils Relative % % 53   90High  R  Neutro Abs 1.7 - 7.7 K/uL 3.2   13.5High    Lymphocytes Relative % 34   6Low  R  Lymphs Abs 0.7 - 4.0 K/uL 2.0   0.9   Monocytes Relative % 7   4 R  Monocytes Absolute 0.1 - 1.0 K/uL 0.4   0.5   Eosinophils Relative % 5   0 R  Eosinophils Absolute 0.0 - 0.5 K/uL 0.3   0.0 R  Basophils Relative % 1   0 R  Basophils Absolute 0.0 - 0.1 K/uL 0.0   0.0   Immature Granulocytes % 0     Abs Immature Granulocytes 0.00 - 0.07 K/uL 0.02      CMP Latest Ref Rng & Units 10/22/2017 06/26/2010  Glucose 70 - 99 mg/dL 98 141(H)  BUN 6 - 20  mg/dL 22(H) 20  Creatinine 0.61 - 1.24 mg/dL 1.02 0.86  Sodium 135 - 145 mmol/L 140 138  Potassium 3.5 - 5.1 mmol/L 4.4 4.6  Chloride 98 - 111 mmol/L 105 101  CO2 22 - 32 mmol/L 25 27  Calcium 8.9 - 10.3 mg/dL 9.8 10.6(H)  Total Protein 6.5 - 8.1 g/dL 7.8 8.0  Total Bilirubin 0.3 - 1.2 mg/dL 1.1 1.3(H)  Alkaline Phos 38 - 126 U/L 49 84  AST 15 - 41 U/L 39 25  ALT 0 - 44 U/L 66(H) 40  Ferritin 380 Iron/TIBC 129/320 Iron saturation 36% Haptoglobin 118 LDH 151 HFE gene mutation: Not identified  Diagnostic/Imaging Studies: June 26, 2010 CT ABDOMEN AND PELVIS WITH CONTRAST  Technique: Multidetector CT imaging of the abdomen and pelvis was performed following the standard protocol during bolus administration of intravenous contrast.  Contrast: 100 ml of omni 300  Comparison: None  Findings:  The lung bases are clear.  No pericardial or pleural effusion identified. There is no focal liver abnormalities identified.  Mild diffuse fatty infiltration of the liver parenchyma noted.  The pancreas appears normal.  The spleen appears normal.  Both adrenal glands are normal.  Gallbladder is identified and appears normal. There is no biliary dilatation.  Normal appearance of the right kidney. Small hypodensity within the upper pole the left kidney is noted. This is too small to characterize.  There is asymmetric left-sided hydronephrosis, hydroureter and perinephric fat stranding. Within the distal left ureter there is a stone measuring 3.2 mm, image 78.  No enlarged upper abdominal lymph nodes.  There is no pelvic or inguinal lymph nodes.  No free fluid or fluid collections within the abdomen or pelvis.  The stomach and the small bowel loops appear normal.  The appendix is identified and is within normal limits.  The proximal colon is negative.  There are multiple diverticula involving the sigmoid colon. No active  inflammation.  Review of the visualized osseous structures is significant for mild spondylosis.  IMPRESSION:  1. Left-sided hydronephrosis and hydroureter secondary to distal left ureteral calculus. 2. Fatty infiltration of the liver.  Angelita Ingles, M.D.  Summary/Assessment: In the setting of mild hyperferritinemia, he presents now for the results of his preliminary evaluation and recommendations.  On September 22, 2017 a serum ferritin was 415 (30-400).  Serum iron was 95; TIBC 336; iron saturation 28%.  On September 19, 2017 prolactin level was 11.3 (4-15.2); LH 3.9 (1.7-8.6); FSH 5.0 (1.5-12.4); serum ferritin 481.  On July 23, 2017 serum testosterone 198; ferritin 481 total cholesterol 184 HDL cholesterol 28 LDL cholesterol 107 triglycerides 245; VLDL 49 on July 11: A complete blood count showed hemoglobin 14.8 hematocrit 43.4 WBC 7.2 with 52% neutrophils 36% lymphocytes 8% monocytes 4% eosinophils; platelets 258,000.  On July 22, 2017 serum testosterone 198 ferritin 481.  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  His alcohol intake consists of beer or wine once or twice monthly. He has had a screening colonoscopy 7 years ago.  He has no prostate cancer or prostatitis. He is not a vegetarian.  At the time of our initial visit, laboratory studies were obtained to exclude liver disease, hemolysis, or metabolic anomaly. His serum ferritin is once again mildly elevated (380).   This does unlikely suggest an iron overload syndrome. The ferritin could also be spuriously elevated as it is also an acute phase reactant. The SGPT is mildly elevated.  The SGOT is normal.  Those results are detailed above.  An abdominal ultrasound from June 26, 2010 suggested mild diffuse fatty infiltration of the liver parenchyma.  We requested a CRP and HFE gene mutation to exclude either a chronic inflammatory condition responsible for increasing his ferritin. Although nonspecific, the CRP is sensitive to  infection/inflammation. He was advised not to take multivitamins with iron or vitamin C, since vitamin C enhances absorption of dietary iron.  The HFE gene mutation was not identified.  Likewise his CRP was normal.  As mentioned earlier, his ferritin is decreasing, 380.    In the interim since his last visit, he reports no new problems or complaints. Both his appetite and weight are stable.  He has no rash or itching.  There is no unusual headache, dizziness, lightheadedness, syncope, or near syncopal episodes.  He has no visual changes or hearing deficit. He denies unusual cough, sore  throat, orthopnea.  He has no pain or difficulty in swallowing.  No fever, shaking chills, sweats, or flulike symptoms are evident.  He has no heartburn or indigestion.  No nausea, vomiting, diarrhea, or constipation are reported.  He denies melena or bright red blood per rectum.  No urinary frequency, urgency, hematuria, or dysuria are evident.  He has no focal bone, joint, or muscle pain.  There is no bleeding tendency or easy bruisability.  He denies numbness or tingling in the fingers or toes.    His other comorbid problems include dyslipidemia; reflux uropathy; primary hypertension; allergic rhinitis; previous renal calculi 9 years earlier; hypogonadism; low testosterone; erectile dysfunction; and fatty liver.  He has had no recent hospitalizations.  He takes multivitamins and grapeseed extract.  He is not taking supplemental iron or vitamin C.  In his family there is no history of hereditary hemochromatosis or iron overload syndrome.   Recommendation/Plan: We discussed in detail the results of his laboratory studies at the time of his initial visit.  His serum ferritin is decreased.  He has no anemia or erythrocytosis.  The very mild elevation in SGPT may reflect some degree of fatty infiltration of the liver.  Given this degree of mild hyperferritinemia without evidence of an underlying iron overload syndrome or  hereditary hemochromatosis, I recommended serial monitoring of his serum ferritin.  A repeat serum ferritin has been scheduled for December 2.  Barring any unforeseen circumstances, he is scheduled for a brief doctor visit following that test on December 3 to discuss the results and recommendations.  The total time spent discussing the results of his laboratory studies and recommendations was 30 minutes. At least 50% of that time was spent in discussion, counseling, and answering questions.  There was ample time to answer all of his questions.  This note was dictated using voice activated technology/software.  Unfortunately, typographical errors are not uncommon, and transcription is subject to mistakes and regrettably misinterpretation.  If necessary, clarification of the above information can be discussed with me at any time.  FOLLOW UP: AS DIRECTED   cc:         Deland Pretty, MD   Henreitta Leber, MD  Hematology/Oncology [place] [address] [place], [place] [address] Office: [phone] VQMG: [phone]

## 2017-11-11 NOTE — Patient Instructions (Addendum)
We discussed the results of your laboratory studies from your initial visit.  The serum ferritin is 380.  A repeat ferritin has been scheduled in 6 weeks from your last blood test on December 2.  Barring any unforeseen complications, your next scheduled doctor visit following that blood test is on December 3.  Please do not hesitate to call in the interim should any new or untoward problems arise.

## 2017-11-11 NOTE — Telephone Encounter (Signed)
Appts scheduled avs/calendar printed per 11/5 los °

## 2017-12-08 ENCOUNTER — Other Ambulatory Visit: Payer: 59

## 2017-12-09 ENCOUNTER — Telehealth: Payer: Self-pay

## 2017-12-09 ENCOUNTER — Ambulatory Visit: Payer: 59 | Admitting: Hematology and Oncology

## 2017-12-09 NOTE — Telephone Encounter (Signed)
Tried calling patient to r/s appointments. left a voice msg with my Levada Dy) ext #. Will call again

## 2017-12-16 ENCOUNTER — Other Ambulatory Visit: Payer: 59

## 2017-12-17 ENCOUNTER — Ambulatory Visit: Payer: 59 | Admitting: Hematology and Oncology

## 2017-12-17 DIAGNOSIS — C4441 Basal cell carcinoma of skin of scalp and neck: Secondary | ICD-10-CM | POA: Diagnosis not present

## 2017-12-18 ENCOUNTER — Telehealth: Payer: Self-pay | Admitting: Internal Medicine

## 2017-12-18 NOTE — Telephone Encounter (Signed)
RR out - moved 12/19 f/u to Dr. Walden Field. Spoke with patient re change and new time for 12/19 f/u at 3 pm. Per patient he is ok to see Dr. Walden Field, however he can not be here until 3:30 pm due to work schedule. Will speak with Dr. Walden Field and get back to patient.

## 2017-12-23 ENCOUNTER — Inpatient Hospital Stay: Payer: 59 | Attending: Hematology and Oncology

## 2017-12-23 DIAGNOSIS — R7989 Other specified abnormal findings of blood chemistry: Secondary | ICD-10-CM | POA: Diagnosis not present

## 2017-12-23 DIAGNOSIS — G4730 Sleep apnea, unspecified: Secondary | ICD-10-CM | POA: Diagnosis not present

## 2017-12-23 DIAGNOSIS — R945 Abnormal results of liver function studies: Secondary | ICD-10-CM | POA: Insufficient documentation

## 2017-12-23 LAB — FERRITIN: Ferritin: 376 ng/mL — ABNORMAL HIGH (ref 24–336)

## 2017-12-24 ENCOUNTER — Telehealth: Payer: Self-pay | Admitting: Internal Medicine

## 2017-12-24 NOTE — Telephone Encounter (Signed)
Left message per 12/17 sch message - for patient to call back to r/s

## 2017-12-25 ENCOUNTER — Inpatient Hospital Stay (HOSPITAL_BASED_OUTPATIENT_CLINIC_OR_DEPARTMENT_OTHER): Payer: 59 | Admitting: Internal Medicine

## 2017-12-25 ENCOUNTER — Encounter: Payer: Self-pay | Admitting: Internal Medicine

## 2017-12-25 VITALS — BP 140/84 | HR 74 | Temp 98.0°F | Resp 17 | Ht 66.0 in | Wt 209.0 lb

## 2017-12-25 DIAGNOSIS — R7989 Other specified abnormal findings of blood chemistry: Secondary | ICD-10-CM | POA: Diagnosis not present

## 2017-12-25 DIAGNOSIS — D751 Secondary polycythemia: Secondary | ICD-10-CM

## 2017-12-25 NOTE — Progress Notes (Signed)
Diagnosis No diagnosis found.  Staging Cancer Staging No matching staging information was found for the patient.  Assessment and Plan:  1.  Elevated Ferritin.  Pt had labs done 12/17 2019 that showed ferritin was 376. I discussed with him that iron studies done 09/2017 showed TS 36.  Hemachromatosis gene evaluation was negative.  HCT 45.4.  Pt reports history of sleep apnea and uses CPAP for past 6 years.  CT of abdomen done 06/26/2010 showed fatty liver.  Pt was given option of ongoing follow-up with repeat labs in 6 months to 1 year.  He reports Dr. Angelina Ok is doing labs in 02/2018. Our office will obtain results for review.  He should notify the office if any change in symptoms prior to next visit. Likely normal varient.  All questions answered and he expressed understanding of the information presented.    2  Elevated LFTs. ALT 66 on labs done 10/2017.  Pt had CT abdomen done 06/26/2010 that showed fatty liver.  He should follow-up with PCP for ongoing monitoring as pt lists Tricor as medication that may affect LFTs.    3  Sleep apnea. Pt reports he has been using CPAP for 6 years.    4.  Scalp lesion.  He reports he was seen and had resection done with pathology reportedly basal cell.  He should follow-up with Dermatology or PCP as directed.   Interval History: Historical data obtained from note dated 11/11/2017.  Pt previously followed by Dr. Audelia Hives.  Labs done September 22, 2017 showed a serum ferritin was 415 (30-400).  Serum iron was 95; TIBC 336; iron saturation 28%.  On September 19, 2017 prolactin level was 11.3 (4-15.2); LH 3.9 (1.7-8.6); FSH 5.0 (1.5-12.4); serum ferritin 481.  On July 23, 2017 serum testosterone 198; ferritin 481 total cholesterol 184 HDL cholesterol 28 LDL cholesterol 107 triglycerides 245; VLDL 49 on July 11: A complete blood count showed hemoglobin 14.8 hematocrit 43.4 WBC 7.2 with 52% neutrophils 36% lymphocytes 8% monocytes 4% eosinophils; platelets 258,000.  On July 22, 2017 serum testosterone 198 ferritin 481.  Pt works full-time as a Software engineer.  His alcohol intake consists of beer or wine once or twice monthly. He has had a screening colonoscopy 7 years ago.  He has no prostate cancer or prostatitis. He is not a vegetarian.  His serum ferritin is once again mildly elevated (380).  Lab evaluation was felt unlikely to suggest an iron overload syndrome.  CT abdomen done 06/26/2010 showed fatty liver.  PT had hemochromatosis testing done 10/2017 that was negative.     Current Status:  Pt is seen today for follow-up to go over labs.    Problem List Patient Active Problem List   Diagnosis Date Noted  . Elevated ferritin [R79.89] 10/22/2017  . Hypertriglyceridemia [E78.1] 10/22/2017  . Fatty liver [K76.0] 10/22/2017  . Allergic rhinitis [J30.9] 10/22/2017  . Hypogonadism in male [E29.1] 10/22/2017  . Low testosterone [R79.89] 10/22/2017    Past Medical History No past medical history on file.  Past Surgical History Scalp biopsy/Resection  Family History No family history on file.   Social History  reports that he has never smoked. He has never used smokeless tobacco. He reports that he does not use drugs.  Medications  Current Outpatient Medications:  .  cetirizine (ZYRTEC) 10 MG chewable tablet, Chew 10 mg by mouth daily., Disp: , Rfl:  .  fenofibrate (TRICOR) 145 MG tablet, Take 145 mg by mouth daily., Disp: , Rfl:  .  fluticasone (FLONASE) 50 MCG/ACT nasal spray, Place into both nostrils daily as needed for allergies or rhinitis., Disp: , Rfl:  .  irbesartan-hydrochlorothiazide (AVALIDE) 300-12.5 MG tablet, Take 1 tablet by mouth daily., Disp: , Rfl:  .  meloxicam (MOBIC) 15 MG tablet, Take 15 mg by mouth daily., Disp: , Rfl:  .  Misc Natural Products (GRAPE SEED COMPLEX) CAPS, Take 2 capsules by mouth every morning. , Disp: , Rfl:  .  Multiple Vitamins-Minerals (MULTIVITAMIN WITH MINERALS) tablet, Take 1 tablet by mouth daily., Disp: , Rfl:   .  omega-3 acid ethyl esters (LOVAZA) 1 g capsule, Take 1 g by mouth 2 (two) times daily. , Disp: , Rfl:  .  tadalafil (ADCIRCA/CIALIS) 20 MG tablet, Take 20 mg by mouth daily as needed for erectile dysfunction., Disp: , Rfl:   Allergies Penicillins  Review of Systems Review of Systems - Oncology ROS negative   Physical Exam  Vitals Wt Readings from Last 3 Encounters:  12/25/17 209 lb (94.8 kg)  11/11/17 211 lb 3.2 oz (95.8 kg)  10/22/17 207 lb 3.2 oz (94 kg)   Temp Readings from Last 3 Encounters:  12/25/17 98 F (36.7 C) (Oral)  11/11/17 99.1 F (37.3 C) (Oral)  10/22/17 98.4 F (36.9 C) (Oral)   BP Readings from Last 3 Encounters:  12/25/17 140/84  11/11/17 135/76  10/22/17 137/75   Pulse Readings from Last 3 Encounters:  12/25/17 74  11/11/17 71  10/22/17 66   Constitutional: Well-developed, well-nourished, and in no distress.   HENT: Head: Normocephalic and atraumatic.  Mouth/Throat: No oropharyngeal exudate. Mucosa moist. Eyes: Pupils are equal, round, and reactive to light. Conjunctivae are normal. No scleral icterus.  Neck: Normal range of motion. Neck supple. No JVD present.  Cardiovascular: Normal rate, regular rhythm and normal heart sounds.  Exam reveals no gallop and no friction rub.   No murmur heard. Pulmonary/Chest: Effort normal and breath sounds normal. No respiratory distress. No wheezes.No rales.  Abdominal: Soft. Bowel sounds are normal. No distension. There is no tenderness. There is no guarding.  Musculoskeletal: No edema or tenderness.  Lymphadenopathy: No cervical, axillary or supraclavicular adenopathy.  Neurological: Alert and oriented to person, place, and time. No cranial nerve deficit.  Skin: Skin is warm and dry. No rash noted. No erythema. No pallor. Pt has bandage covering scalp area due to reported recent surgery.   Psychiatric: Affect and judgment normal.   Labs Appointment on 12/23/2017  Component Date Value Ref Range Status   . Ferritin 12/23/2017 376* 24 - 336 ng/mL Final   Performed at [facility], [place] [address], [place], [place] [address]     Pathology No orders of the defined types were placed in this encounter.      Zoila Shutter MD

## 2017-12-26 ENCOUNTER — Telehealth: Payer: Self-pay | Admitting: Internal Medicine

## 2017-12-26 NOTE — Telephone Encounter (Signed)
Per 12/19 los Return in about 1 year (around 12/26/2018), or if symptoms worsen or fail to improve.  Will have to schedule appointment when the template is available.

## 2018-03-09 DIAGNOSIS — E782 Mixed hyperlipidemia: Secondary | ICD-10-CM | POA: Diagnosis not present

## 2018-03-09 DIAGNOSIS — E291 Testicular hypofunction: Secondary | ICD-10-CM | POA: Diagnosis not present

## 2018-03-09 DIAGNOSIS — I10 Essential (primary) hypertension: Secondary | ICD-10-CM | POA: Diagnosis not present

## 2018-03-11 DIAGNOSIS — E291 Testicular hypofunction: Secondary | ICD-10-CM | POA: Diagnosis not present

## 2018-03-11 DIAGNOSIS — I10 Essential (primary) hypertension: Secondary | ICD-10-CM | POA: Diagnosis not present

## 2018-03-25 DIAGNOSIS — E782 Mixed hyperlipidemia: Secondary | ICD-10-CM | POA: Diagnosis not present

## 2018-03-25 DIAGNOSIS — R899 Unspecified abnormal finding in specimens from other organs, systems and tissues: Secondary | ICD-10-CM | POA: Diagnosis not present

## 2018-03-25 DIAGNOSIS — M1712 Unilateral primary osteoarthritis, left knee: Secondary | ICD-10-CM | POA: Diagnosis not present

## 2018-03-25 DIAGNOSIS — E291 Testicular hypofunction: Secondary | ICD-10-CM | POA: Diagnosis not present

## 2018-04-05 DIAGNOSIS — G4733 Obstructive sleep apnea (adult) (pediatric): Secondary | ICD-10-CM | POA: Diagnosis not present

## 2021-01-02 ENCOUNTER — Other Ambulatory Visit: Payer: Self-pay | Admitting: Internal Medicine

## 2021-01-02 DIAGNOSIS — I10 Essential (primary) hypertension: Secondary | ICD-10-CM

## 2021-01-25 ENCOUNTER — Ambulatory Visit
Admission: RE | Admit: 2021-01-25 | Discharge: 2021-01-25 | Disposition: A | Discharge status: Home / Self Care | Payer: No Typology Code available for payment source | Source: Ambulatory Visit | Attending: Internal Medicine | Admitting: Internal Medicine

## 2021-01-25 DIAGNOSIS — I10 Essential (primary) hypertension: Secondary | ICD-10-CM
